# Patient Record
Sex: Female | Born: 1935 | Race: White | Hispanic: No
Health system: Southern US, Community
[De-identification: ages and names within clinical notes are randomized; demographics above are authoritative.]

## PROBLEM LIST (undated history)

## (undated) DIAGNOSIS — F0390 Unspecified dementia without behavioral disturbance: Secondary | ICD-10-CM

## (undated) DIAGNOSIS — I10 Essential (primary) hypertension: Secondary | ICD-10-CM

## (undated) DIAGNOSIS — G459 Transient cerebral ischemic attack, unspecified: Secondary | ICD-10-CM

## (undated) DIAGNOSIS — I2699 Other pulmonary embolism without acute cor pulmonale: Secondary | ICD-10-CM

## (undated) DIAGNOSIS — I82409 Acute embolism and thrombosis of unspecified deep veins of unspecified lower extremity: Secondary | ICD-10-CM

## (undated) DIAGNOSIS — K219 Gastro-esophageal reflux disease without esophagitis: Secondary | ICD-10-CM

## (undated) HISTORY — PX: HEMORRHOID SURGERY: SHX153

## (undated) HISTORY — PX: APPENDECTOMY: SHX54

---

## 2006-05-03 ENCOUNTER — Ambulatory Visit: Payer: Self-pay

## 2010-07-18 ENCOUNTER — Inpatient Hospital Stay (HOSPITAL_COMMUNITY)
Admission: EM | Admit: 2010-07-18 | Discharge: 2010-07-21 | DRG: 392 | Disposition: A | Discharge status: Home Health | Payer: Medicare Other | Source: Ambulatory Visit | Attending: Family Medicine | Admitting: Family Medicine

## 2010-07-18 ENCOUNTER — Emergency Department (HOSPITAL_COMMUNITY): Payer: Medicare Other

## 2010-07-18 DIAGNOSIS — K219 Gastro-esophageal reflux disease without esophagitis: Secondary | ICD-10-CM | POA: Diagnosis present

## 2010-07-18 DIAGNOSIS — E039 Hypothyroidism, unspecified: Secondary | ICD-10-CM | POA: Diagnosis present

## 2010-07-18 DIAGNOSIS — A088 Other specified intestinal infections: Principal | ICD-10-CM | POA: Diagnosis present

## 2010-07-18 DIAGNOSIS — F0390 Unspecified dementia without behavioral disturbance: Secondary | ICD-10-CM | POA: Diagnosis present

## 2010-07-18 DIAGNOSIS — I10 Essential (primary) hypertension: Secondary | ICD-10-CM | POA: Diagnosis present

## 2010-07-18 DIAGNOSIS — R339 Retention of urine, unspecified: Secondary | ICD-10-CM | POA: Diagnosis present

## 2010-07-18 LAB — URINALYSIS, ROUTINE W REFLEX MICROSCOPIC
Bilirubin Urine: NEGATIVE
Glucose, UA: NEGATIVE mg/dL
Hgb urine dipstick: NEGATIVE
Ketones, ur: NEGATIVE mg/dL
Nitrite: NEGATIVE
Protein, ur: NEGATIVE mg/dL
Specific Gravity, Urine: 1.02 (ref 1.005–1.030)
Urobilinogen, UA: 0.2 mg/dL (ref 0.0–1.0)
pH: 5 (ref 5.0–8.0)

## 2010-07-18 LAB — COMPREHENSIVE METABOLIC PANEL WITH GFR
ALT: 17 U/L (ref 0–35)
AST: 23 U/L (ref 0–37)
Alkaline Phosphatase: 65 U/L (ref 39–117)
CO2: 19 meq/L (ref 19–32)
Chloride: 106 meq/L (ref 96–112)
GFR calc Af Amer: 57 mL/min — ABNORMAL LOW (ref >60)
GFR calc non Af Amer: 47 mL/min — ABNORMAL LOW (ref >60)
Glucose, Bld: 115 mg/dL — ABNORMAL HIGH (ref 70–99)
Potassium: 3.1 meq/L — ABNORMAL LOW (ref 3.5–5.1)
Sodium: 135 meq/L (ref 135–145)

## 2010-07-18 LAB — DIFFERENTIAL
Band Neutrophils: 0 % (ref 0–10)
Basophils Absolute: 0 10*3/uL (ref 0.0–0.1)
Basophils Relative: 0 % (ref 0–1)
Blasts: 0 %
Eosinophils Absolute: 0.1 K/uL (ref 0.0–0.7)
Eosinophils Relative: 1 % (ref 0–5)
Lymphocytes Relative: 14 % (ref 12–46)
Lymphs Abs: 1.5 10*3/uL (ref 0.7–4.0)
Metamyelocytes Relative: 0 %
Monocytes Absolute: 1 K/uL (ref 0.1–1.0)
Monocytes Relative: 9 % (ref 3–12)
Myelocytes: 0 %
Neutro Abs: 8.4 10*3/uL — ABNORMAL HIGH (ref 1.7–7.7)
Neutrophils Relative %: 76 % (ref 43–77)
Promyelocytes Absolute: 0 %
nRBC: 0 /100{WBCs}

## 2010-07-18 LAB — COMPREHENSIVE METABOLIC PANEL
Albumin: 2.9 g/dL — ABNORMAL LOW (ref 3.5–5.2)
BUN: 28 mg/dL — ABNORMAL HIGH (ref 6–23)
Calcium: 8.1 mg/dL — ABNORMAL LOW (ref 8.4–10.5)
Creatinine, Ser: 1.09 mg/dL (ref 0.4–1.2)
Total Bilirubin: 0.5 mg/dL (ref 0.3–1.2)
Total Protein: 5.8 g/dL — ABNORMAL LOW (ref 6.0–8.3)

## 2010-07-18 LAB — CBC
HCT: 34.7 % — ABNORMAL LOW (ref 36.0–46.0)
Hemoglobin: 11.9 g/dL — ABNORMAL LOW (ref 12.0–15.0)
MCH: 30.5 pg (ref 26.0–34.0)
MCHC: 34.3 g/dL (ref 30.0–36.0)
MCV: 89 fL (ref 78.0–100.0)
Platelets: 252 K/uL (ref 150–400)
RBC: 3.9 MIL/uL (ref 3.87–5.11)
RDW: 13.5 % (ref 11.5–15.5)
WBC: 11 K/uL — ABNORMAL HIGH (ref 4.0–10.5)

## 2010-07-18 LAB — OCCULT BLOOD, POC DEVICE: Fecal Occult Bld: POSITIVE

## 2010-07-18 LAB — LACTIC ACID, PLASMA: Lactic Acid, Venous: 1.4 mmol/L (ref 0.5–2.2)

## 2010-07-19 DIAGNOSIS — E860 Dehydration: Secondary | ICD-10-CM

## 2010-07-19 DIAGNOSIS — E118 Type 2 diabetes mellitus with unspecified complications: Secondary | ICD-10-CM

## 2010-07-19 DIAGNOSIS — R197 Diarrhea, unspecified: Secondary | ICD-10-CM

## 2010-07-19 LAB — BASIC METABOLIC PANEL
BUN: 21 mg/dL (ref 6–23)
Chloride: 114 mEq/L — ABNORMAL HIGH (ref 96–112)
Glucose, Bld: 104 mg/dL — ABNORMAL HIGH (ref 70–99)
Potassium: 3.5 mEq/L (ref 3.5–5.1)

## 2010-07-19 LAB — CBC
HCT: 32.8 % — ABNORMAL LOW (ref 36.0–46.0)
MCV: 91.4 fL (ref 78.0–100.0)
RBC: 3.59 MIL/uL — ABNORMAL LOW (ref 3.87–5.11)
WBC: 6.8 10*3/uL (ref 4.0–10.5)

## 2010-07-19 LAB — TSH: TSH: 3.343 u[IU]/mL (ref 0.350–4.500)

## 2010-07-20 LAB — BASIC METABOLIC PANEL
BUN: 15 mg/dL (ref 6–23)
Chloride: 114 mEq/L — ABNORMAL HIGH (ref 96–112)
Glucose, Bld: 85 mg/dL (ref 70–99)
Potassium: 3.5 mEq/L (ref 3.5–5.1)

## 2010-07-20 LAB — FECAL LACTOFERRIN, QUANT: Fecal Lactoferrin: POSITIVE

## 2010-07-21 LAB — CBC
HCT: 30.7 % — ABNORMAL LOW (ref 36.0–46.0)
Hemoglobin: 10.4 g/dL — ABNORMAL LOW (ref 12.0–15.0)
MCV: 89.2 fL (ref 78.0–100.0)
RDW: 13.6 % (ref 11.5–15.5)
WBC: 6.8 10*3/uL (ref 4.0–10.5)

## 2010-07-21 LAB — BASIC METABOLIC PANEL
BUN: 12 mg/dL (ref 6–23)
CO2: 22 mEq/L (ref 19–32)
Chloride: 115 mEq/L — ABNORMAL HIGH (ref 96–112)
GFR calc non Af Amer: 55 mL/min — ABNORMAL LOW (ref >60)
Glucose, Bld: 87 mg/dL (ref 70–99)
Potassium: 3.3 mEq/L — ABNORMAL LOW (ref 3.5–5.1)

## 2010-07-23 LAB — STOOL CULTURE

## 2010-07-24 LAB — CULTURE, BLOOD (ROUTINE X 2)
Culture  Setup Time: 201204152001
Culture  Setup Time: 201204152001
Culture: NO GROWTH
Culture: NO GROWTH

## 2010-07-26 NOTE — Discharge Summary (Signed)
Breanna Washington, Breanna Washington                  ACCOUNT NO.:  [id number]  MEDICAL RECORD NO.:  [id number]           PATIENT TYPE:  I  LOCATION:  6741                         FACILITY:  [facility]  PHYSICIAN:  Santiago Bumpers. Hensel, M.D.DATE OF BIRTH:  Jun 28, 1919  DATE OF ADMISSION:  07/18/2010 DATE OF DISCHARGE:  07/21/2010                              DISCHARGE SUMMARY   DISCHARGE DIAGNOSES: 1. Diarrhea and weakness secondary to viral gastroenteritis. 2. Urinary retention and overflow incontinence. 3. Hypertension. 4. Dementia. 5. History of gastroesophageal reflux disease. 6. History of hypothyroidism.  DISCHARGE MEDICATIONS:  New medications at discharge: 1. Bethanechol 5 mg p.o. t.i.d.  Home medications continued: 1. Central Silver half tablet p.o. daily. 2. Namenda 10 mg p.o. twice daily. 3. Omeprazole 20 mg p.o. daily at bedtime. 4. Synthroid 25 mcg 1 tablet p.o. daily.  Home medications to stop: 1. HCTZ 12.5 p.o. q.a.m. 2. Afeditab CR 30 mg 1 tablet p.o. q.a.m.  PERTINENT LABS AT DISCHARGE:  Stool cultures, no suspicious colonies, continuing to hold.  Creatinine 0.96.  Potassium 3.3.  Hemoglobin 10.4, hematocrit 30.7, white blood cells 6.8, platelets 234.  Stool Crypto and stool Giardia negative.  Fecal lactoferrin positive.  TSH 3.343.  C. diff PCR negative.  Blood cultures, no growth to date x3 days.  CONSULTS:  None.  BRIEF HOSPITAL COURSE:  The patient is an 77 year old female who came in with history of chronic diarrhea, acutely worsened over 30 hours prior to admission. 1. Diarrhea.  The patient was initially evaluated, found to be     hypotensive on admission with following blood pressure despite     fluid resuscitation, very weak on exam without focal neurological     deficits.  It was determined that she was dehydrated and have     persistent diarrhea possibly from infectious etiology viral or     bacterial workup considered.  C. diff PCR was obtained.  The     patient  was started on p.o. vancomycin as well as IV vancomycin and     Zosyn for concerning sepsis given that the patient was hypotensive     on initial evaluation.  The patient's blood cultures were negative     as mentioned above as well as C. diff PCR and stool cultures, stool     Giardia and Crypto.  She is resistant to all antibiotic therapy.     She was rehydrated with IV fluids, transitioned to a normal p.o.     diet.  The patient was initially admitted to step-down unit, but     then to the floor and did well without antibiotic therapy for     greater than 24 hours.  She was afebrile.  Her white count trend     down from initial white count of 11 to discharge white count of     6.8. 2. Weakness.  The patient was acutely deconditioned from her illness.     She was evaluated by PT during her hospital course, and if her     strength improved, it was determined that she would benefit from  home health PT for continued rehab.  She was able to walk her     walker with some assistance and ambulate to the bedside commode. 3. Urinary retention.  On hospital day #2, it was determined that the     patient is suffering from urinary retention.  Bladder scans     revealed elevated volumes of urine, pre and postvoid, initially 494     prevoid and 374 postvoid.  The patient was started on bethanechol 5     mg p.o. t.i.d.  The next bladder scan obtained the following day     was greater than 999 mL prevoid, down to 72 postvoid.  It was     determined that bethanechol was effective with most likely a     longstanding overflow incontinence from neurogenic bladder.  At     discharge, the patient is instructed to continue bethanechol and to     schedule voids every 4 hours while awake. 4. Hypertension.  The patient has a history of hypertension, treated     with HCTZ and Afeditab.  On admission, both were held since the     patient was hypotensive.  At discharge, the patient's blood     pressures are  well controlled without treatment.  Her blood     pressures are ranged from 123-145 systolic over 61-72 diastolic.     Plan is for the patient to discontinue her antihypertensive therapy     with goal systolic blood pressure less than or equal to 60. 5. Dementia.  The patient's mental status was stable, well controlled     on her home dose of Namenda. 6. History of hypothyroidism.  The patient's TSH was normal.  As     mentioned above, she continued on her home dose of Synthroid. 7. History of GERD.  The patient was taking Protonix during her     hospital stay and discharged on her home dose of omeprazole.  DISCHARGE PHYSICAL EXAM:  At discharge, the patient is in no acute distress with normal heart and lung exam.  Her abdomen is soft, nontender, nondistended with normoactive bowel sounds.  Exam of her rectum reveals an irritated gluteal cleft with slightly decreased rectal tone, loose watery stool in the rectal vault with some flecks of blood, but no frank blood.  The patient was fecal occult blood positive on admission likely secondary to irritation.  DISCHARGE INSTRUCTIONS:  The patient is discharged to home with instructions to advance activity slowly, to walk with her walker, no restrictions on diet, no wound care instructions.  She is instructed to follow up with her primary MD, Dr. Loma Sender in [place].  She was provided the number and instructed to call for an appointment within the next 3-4 weeks.  She is also instructed to begin home health PT to be set up by the patient's social worker.  Other instructions to void regularly every 4 hours while awake, to apply barrier cream either A and D or Desitin to her bottom 2-3 times a day for irritation.  FOLLOWUP ISSUES AND RECOMMENDATIONS: 1. Diarrhea.  The patient's diarrhea should resolve within the next     week.  Instructed not to use antidiarrhea medications, but is said     to keep up with losses with regular meals  and increased fluid     intake. 2. Urinary retention.  The patient will started on bethanechol, this     is a new medication, follow up compliance in complications  regarding urinary retention.  The patient's admission urinalysis     was within normal limits. 3. Hypertension.  The patient is discharged off of her home     antihypertensive regimen.  Follow up blood pressures in the need     for possibly restarting one or both medications.  DISCHARGE CONDITION:  The patient is discharged to home in stable medical condition.    ______________________________ Dessa Phi, MD   ______________________________ Santiago Bumpers. Leveda Anna, M.D.    JF/MEDQ  D:  07/21/2010  T:  07/22/2010  Job:  [id number]  Electronically Signed by Dessa Phi MD on 07/25/2010 01:46:25 AM Electronically Signed by Doralee Albino M.D. on 07/26/2010 11:14:29 AM

## 2010-08-02 NOTE — H&P (Signed)
Breanna Washington, Breanna Washington                  ACCOUNT NO.:  [id number]  MEDICAL RECORD NO.:  [id number]           PATIENT TYPE:  I  LOCATION:  3312                         FACILITY:  [facility]  PHYSICIAN:  Deb Loudin A. Sheffield Slider, M.D.    DATE OF BIRTH:  01/24/1920  DATE OF ADMISSION:  07/18/2010 DATE OF DISCHARGE:                             HISTORY & PHYSICAL   PRIMARY CARE PHYSICIAN:  Dr. Loma Sender in [place].  CHIEF COMPLAINT:  Diarrhea and weakness.  HISTORY OF PRESENT ILLNESS:  The patient is an 77 year old female with chronic constipation at baseline who presents with a 92-month history of intermittent diarrhea followed by a 1 day history of persistent diarrhea.  She has had 7 stools in the past 12 hours prior to arrival to the ED.  Stools are described as loose, nonbloody, moderate volume. This morning, she also had bilateral lower abdominal pain that radiated to the anterior aspect of both thighs.  She and family denied nausea and vomiting.  She does have a sick contact in the form of her son, who is visiting and had a GI illness with nausea and diarrhea.  She has had increased fatigue, decreased appetite, and decreased activity over the past 2 days.  She usually ambulates at home with her walker, but has had no interest in doing so for the past and a half.  No fevers at home, but she did so want to touch last night and she is febrile in the ED.  In the ED, she received Zofran for IV x1, Tylenol 1 g p.o. x1, Imodium 2 mg p.o. x 1, 20 mEq of KCl, 500 mL bolus of normal saline x2, then 125 mL per hour.  Code status DNR/DNI health care power of attorney with her daughter, Alvino Chapel and her son.  ALLERGIES:  No known drug allergies.  MEDICATIONS: 1. Namenda 10 mg p.o. q.a.m. and q.p.m. 2. Feratab 30 mg p.o. q.a.m. 3. Synthroid 25 mcg q.a.m. 4. Aspirin 81 mg p.o. daily. 5. Hydrochlorothiazide 12.5 mg p.o. daily. 6. Omeprazole 20 mg p.o. daily. 7. Centrum Silver 1 tablet p.o.  daily.  PAST MEDICAL HISTORY: 1. Significant for dementia.  The patient occasionally voices desire     to die, stopped eating.  Her symptoms worsened when her husband     passed away last year. 2. Hypertension. 3. Hypothyroidism. 4. GERD. 5. No known screening for colonoscopy.  PAST SURGICAL HISTORY:  Appendectomy in about 1950, hemorrhoidectomy in about 6.  SOCIAL HISTORY:  The patient lives with her son and daughter-in-law. She chews tobacco and has since age 81.  Denies alcohol, denies drugs.  FAMILY HISTORY:  Noncontributory.  REVIEW OF SYSTEMS:  Pertinent positives for fever, decreased appetite, increased fatigue, diarrhea, abdominal pain.  Pertinent negatives no nausea, no vomiting, no dysphagia, no hematemesis, no bright red blood per rectum, no melena, no chills, no night sweats, no weight change. All remainder of her 11-point review of systems negative.  PHYSICAL EXAMINATION:  VITAL SIGNS:  Temperature 101.7 down to 99.1, pulse 155, respiratory rate 16-18, blood pressure on admission 141/63, last checked 107/40, pO2 of 97%  on room air. GENERAL:  The patient is an elderly woman lying in bed. HEENT:  NCAT.  Pinpoint pupils.  Dry mucous membrane.  Edentate. 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  NECK:  Supple, nontender.  No lymphadenopathy. CV:  S1 and S2.  Regular rate and rhythm. LUNGS:  Normal work of breathing.  Clear to auscultation bilaterally. No wheezes, rhonchi, or crackles. ABDOMEN:  Normoactive bowel sounds, soft, tender to palpation in epigastric area and bilateral lower quadrants.  No suprapubic tenderness.  No rebound, no guarding. BACK:  Nontender. RECTAL:  No external hemorrhoids.  No skin tag.  No surgeries.  No blood.  Normal tone.  No frank blood in stool. EXTREMITIES:  Nontender.  No edema. NEURO:  Alert and oriented x2 to person, place, not to time.  Able  to recognize daughter.  Cranial nerves II through XII were grossly intact. A 5/5 strength in bilateral upper and lower extremities. MUSCULOSKELETAL:  Nontender.  LABS AND STUDIES:  White count 11, hemoglobin 11.9, hematocrit 34.7, platelets 52, 8.4% neutrophils.  Fecal occult blood positive.  Lactic acid 1.4, potassium 3.1, creatinine 1.09, albumin 2.9, calcium 8.1, corrected calcium of 8.98.  Abdominal series within normal limits. Normal bowel gas pattern.  UA specific gravity 1.02, otherwise negative.  ASSESSMENT AND PLAN:  For this 77 year old female with acute worsening diarrhea, hypotension, and fever.  1. Diarrhea.  Clinical picture was hospitalized with the symptoms,     concerning for infectious etiology, viral multifactorial colitis.     Given the patient's blood pressures are low despite fluid     resuscitation, the bacterial etiology is most concerning and the     possibility of sepsis and septic shock is also concerning.  Plan to     admit to step-down unit.  Continue aggressive hydration with     another 500 mL bolus x2 and then D5 half normal saline with 20 of     KCl at 125 mL an hour.  We will obtain pancultures, procalcitonin,     and chest x-Thurow and start empiric antibiotic therapy in the form of     vanc and Zosyn IV and p.o. vanc to cover for C. diff.  We will     discontinue IV antibiotics.  Her blood cultures are negative for 48     hours.  If the patient does go through septic shock, it should be     reminded that she is DNR/DNI. 2. Fluids, electrolytes, nutrition/gastrointestinal:  The patient with     hypokalemia, likely from persistent diarrhea.  We will replete with     IV fluids.  Follow up a.m. BMET, give regular diet. 3. DVT prophylaxis.  SCDs. 4. Dementia.  The patient has waxing and waning level of alertness.     We will continue home Namenda. 5. History of hypertension.  Hypotensive now hold all antihypertensive     medications. 6. Hypothyroidism.   Continue home Synthroid. 7. GI prophylaxis.  Protonix 40 mg p.o. b.i.d. 8. Disposition.  Pending clinical improvement, back to home.  We will     obtain home health PT consult to evaluate for home health needs.    ______________________________ Dessa Phi, MD   ______________________________ Arnette Norris. Sheffield Slider, M.D.    JF/MEDQ  D:  07/18/2010  T:  07/19/2010  Job:  [id number]  Electronically Signed by Dessa Phi MD on 07/25/2010 01:44:56 AM Electronically Signed by Zachery Dauer M.D. on 08/02/2010 04:42:04  AM

## 2011-04-30 LAB — LIPASE, BLOOD: Lipase: 199 U/L (ref 73–393)

## 2011-04-30 LAB — COMPREHENSIVE METABOLIC PANEL
Alkaline Phosphatase: 84 U/L (ref 50–136)
Bilirubin,Total: 0.5 mg/dL (ref 0.2–1.0)
Calcium, Total: 9 mg/dL (ref 8.5–10.1)
Chloride: 104 mmol/L (ref 98–107)
Co2: 29 mmol/L (ref 21–32)
Creatinine: 1.05 mg/dL (ref 0.60–1.30)
EGFR (Non-African Amer.): 52 — ABNORMAL LOW
Osmolality: 289 (ref 275–301)
SGPT (ALT): 15 U/L
Sodium: 143 mmol/L (ref 136–145)

## 2011-04-30 LAB — CBC
HGB: 12.7 g/dL (ref 12.0–16.0)
MCH: 30.6 pg (ref 26.0–34.0)
MCHC: 33.6 g/dL (ref 32.0–36.0)
MCV: 91 fL (ref 80–100)
RBC: 4.14 10*6/uL (ref 3.80–5.20)

## 2011-04-30 LAB — CK TOTAL AND CKMB (NOT AT ARMC): CK-MB: <0.5 ng/mL — ABNORMAL LOW (ref 0.5–3.6)

## 2011-04-30 LAB — TROPONIN I: Troponin-I: <0.02 ng/mL

## 2011-05-01 ENCOUNTER — Inpatient Hospital Stay: Payer: Self-pay | Admitting: Internal Medicine

## 2011-05-01 LAB — PROTIME-INR: Prothrombin Time: 12.6 secs (ref 11.5–14.7)

## 2011-05-02 LAB — BASIC METABOLIC PANEL
Anion Gap: 10 (ref 7–16)
BUN: 23 mg/dL — ABNORMAL HIGH (ref 7–18)
Calcium, Total: 8.4 mg/dL — ABNORMAL LOW (ref 8.5–10.1)
Chloride: 107 mmol/L (ref 98–107)
Osmolality: 289 (ref 275–301)

## 2011-05-03 LAB — PROTIME-INR
INR: 1.2
Prothrombin Time: 15.8 secs — ABNORMAL HIGH (ref 11.5–14.7)

## 2011-05-04 LAB — PROTIME-INR
INR: 1.2
Prothrombin Time: 15.3 secs — ABNORMAL HIGH (ref 11.5–14.7)

## 2011-05-04 LAB — PLATELET COUNT: Platelet: 255 10*3/uL (ref 150–440)

## 2011-05-05 LAB — PROTIME-INR: Prothrombin Time: 20.7 secs — ABNORMAL HIGH (ref 11.5–14.7)

## 2011-05-06 LAB — PROTIME-INR: Prothrombin Time: 26.7 secs — ABNORMAL HIGH (ref 11.5–14.7)

## 2011-10-12 ENCOUNTER — Emergency Department: Payer: Self-pay | Admitting: *Deleted

## 2011-10-12 LAB — BASIC METABOLIC PANEL
BUN: 18 mg/dL (ref 7–18)
Creatinine: 1.21 mg/dL (ref 0.60–1.30)
EGFR (African American): 46 — ABNORMAL LOW
EGFR (Non-African Amer.): 40 — ABNORMAL LOW
Glucose: 114 mg/dL — ABNORMAL HIGH (ref 65–99)
Osmolality: 293 (ref 275–301)

## 2011-10-12 LAB — URINALYSIS, COMPLETE
Blood: NEGATIVE
Glucose,UR: NEGATIVE mg/dL (ref 0–75)
Nitrite: NEGATIVE
Ph: 6 (ref 4.5–8.0)
RBC,UR: 2 /HPF (ref 0–5)

## 2011-10-12 LAB — CBC
HCT: 38.6 % (ref 35.0–47.0)
MCH: 30.4 pg (ref 26.0–34.0)
MCHC: 33.8 g/dL (ref 32.0–36.0)
RDW: 13 % (ref 11.5–14.5)

## 2011-10-12 LAB — TROPONIN I: Troponin-I: <0.02 ng/mL

## 2011-10-12 LAB — CK TOTAL AND CKMB (NOT AT ARMC): CK-MB: 0.7 ng/mL (ref 0.5–3.6)

## 2011-10-19 ENCOUNTER — Encounter (HOSPITAL_COMMUNITY): Payer: Self-pay | Admitting: *Deleted

## 2011-10-19 ENCOUNTER — Emergency Department (HOSPITAL_COMMUNITY)
Admission: EM | Admit: 2011-10-19 | Discharge: 2011-10-20 | Disposition: A | Discharge status: Home / Self Care | Payer: Medicare Other | Attending: Emergency Medicine | Admitting: Emergency Medicine

## 2011-10-19 DIAGNOSIS — K219 Gastro-esophageal reflux disease without esophagitis: Secondary | ICD-10-CM | POA: Insufficient documentation

## 2011-10-19 DIAGNOSIS — R109 Unspecified abdominal pain: Secondary | ICD-10-CM

## 2011-10-19 DIAGNOSIS — Z86718 Personal history of other venous thrombosis and embolism: Secondary | ICD-10-CM | POA: Insufficient documentation

## 2011-10-19 DIAGNOSIS — I10 Essential (primary) hypertension: Secondary | ICD-10-CM | POA: Insufficient documentation

## 2011-10-19 DIAGNOSIS — N390 Urinary tract infection, site not specified: Secondary | ICD-10-CM | POA: Insufficient documentation

## 2011-10-19 DIAGNOSIS — Z86711 Personal history of pulmonary embolism: Secondary | ICD-10-CM | POA: Insufficient documentation

## 2011-10-19 DIAGNOSIS — R197 Diarrhea, unspecified: Secondary | ICD-10-CM | POA: Insufficient documentation

## 2011-10-19 DIAGNOSIS — Z8673 Personal history of transient ischemic attack (TIA), and cerebral infarction without residual deficits: Secondary | ICD-10-CM | POA: Insufficient documentation

## 2011-10-19 DIAGNOSIS — F0390 Unspecified dementia without behavioral disturbance: Secondary | ICD-10-CM | POA: Insufficient documentation

## 2011-10-19 HISTORY — DX: Gastro-esophageal reflux disease without esophagitis: K21.9

## 2011-10-19 HISTORY — DX: Other pulmonary embolism without acute cor pulmonale: I26.99

## 2011-10-19 HISTORY — DX: Unspecified dementia, unspecified severity, without behavioral disturbance, psychotic disturbance, mood disturbance, and anxiety: F03.90

## 2011-10-19 HISTORY — DX: Acute embolism and thrombosis of unspecified deep veins of unspecified lower extremity: I82.409

## 2011-10-19 HISTORY — DX: Transient cerebral ischemic attack, unspecified: G45.9

## 2011-10-19 HISTORY — DX: Essential (primary) hypertension: I10

## 2011-10-19 LAB — COMPREHENSIVE METABOLIC PANEL
Albumin: 3.9 g/dL (ref 3.5–5.2)
Alkaline Phosphatase: 73 U/L (ref 39–117)
BUN: 18 mg/dL (ref 6–23)
Chloride: 103 mEq/L (ref 96–112)
Glucose, Bld: 121 mg/dL — ABNORMAL HIGH (ref 70–99)
Potassium: 3.4 mEq/L — ABNORMAL LOW (ref 3.5–5.1)
Total Bilirubin: 0.3 mg/dL (ref 0.3–1.2)

## 2011-10-19 LAB — URINE MICROSCOPIC-ADD ON

## 2011-10-19 LAB — URINALYSIS, ROUTINE W REFLEX MICROSCOPIC
Glucose, UA: NEGATIVE mg/dL
Protein, ur: NEGATIVE mg/dL
pH: 6 (ref 5.0–8.0)

## 2011-10-19 LAB — CBC WITH DIFFERENTIAL/PLATELET
Blasts: 0 %
Lymphocytes Relative: 25 % (ref 12–46)
Lymphs Abs: 1.3 10*3/uL (ref 0.7–4.0)
MCHC: 33.8 g/dL (ref 30.0–36.0)
Neutro Abs: 3.3 10*3/uL (ref 1.7–7.7)
Neutrophils Relative %: 65 % (ref 43–77)
Platelets: 324 10*3/uL (ref 150–400)
Promyelocytes Absolute: 0 %
RDW: 13.2 % (ref 11.5–15.5)

## 2011-10-19 LAB — PROTIME-INR: Prothrombin Time: 23.5 seconds — ABNORMAL HIGH (ref 11.6–15.2)

## 2011-10-19 MED ORDER — SODIUM CHLORIDE 0.9 % IV BOLUS (SEPSIS)
1000.0000 mL | Freq: Once | INTRAVENOUS | Status: AC
  Administered 2011-10-19: 1000 mL via INTRAVENOUS

## 2011-10-19 MED ORDER — MORPHINE SULFATE 4 MG/ML IJ SOLN
4.0000 mg | Freq: Once | INTRAMUSCULAR | Status: DC
  Filled 2011-10-19: qty 1

## 2011-10-19 NOTE — ED Notes (Signed)
Patient reported to be seen at [facility] last week and dx with virus.  She had been seen for ongoing diarrhea.    Patient was seen for her coumadin check on yesterday and told her that her stomach pain is related to gerd.  Patient had onset of severe stomach pain last night and family reports blood in her stool.  Patient states she just feels bad

## 2011-10-19 NOTE — ED Notes (Signed)
States she has had diarrhea for quite awhile. Getting progressively worse. Also complaining of abdominal pain. Noted blood in her stool last night. Noted change in smell to her stool. Patient takes coumadin

## 2011-10-19 NOTE — ED Provider Notes (Signed)
History     CSN: [id number]  Arrival date & time 10/19/11  1435   First MD Initiated Contact with Patient 10/19/11 2110      Chief Complaint  Patient presents with  . Abdominal Pain  . Rectal Bleeding    (Consider location/radiation/quality/duration/timing/severity/associated sxs/prior treatment) Patient is a 78 y.o. female presenting with abdominal pain. The history is provided by the patient and a relative.  Abdominal Pain The primary symptoms of the illness include diarrhea. The primary symptoms of the illness do not include abdominal pain, fever, fatigue, shortness of breath, nausea, vomiting or dysuria. The current episode started more than 2 days ago. The problem has been gradually worsening.  The diarrhea is unusually odiferous and watery. Risk factors: unknown.  The patient has had a change in bowel habit. Symptoms associated with the illness do not include chills, constipation, urgency, frequency or back pain. Significant associated medical issues do not include inflammatory bowel disease.    Past Medical History  Diagnosis Date  . Dementia   . GERD (gastroesophageal reflux disease)   . Deep vein thrombosis   . Pulmonary embolism   . Hypertension   . TIA (transient ischemic attack)     Past Surgical History  Procedure Date  . Appendectomy   . Hemorrhoid surgery     No family history on file.  History  Substance Use Topics  . Smoking status: Not on file  . Smokeless tobacco: Current User    Types: Snuff  . Alcohol Use: No    OB History    Grav Para Term Preterm Abortions TAB SAB Ect Mult Living                  Review of Systems  Constitutional: Negative for fever, chills and fatigue.  HENT: Negative for congestion and rhinorrhea.   Respiratory: Negative for cough and shortness of breath.   Cardiovascular: Negative for chest pain and palpitations.  Gastrointestinal: Positive for diarrhea, blood in stool and abdominal distention (mild). Negative for  nausea, vomiting, abdominal pain and constipation.  Genitourinary: Negative for dysuria, urgency and frequency.  Musculoskeletal: Negative for back pain.  Skin: Negative for color change and rash.  Neurological: Negative for light-headedness and headaches.  All other systems reviewed and are negative.    Allergies  Review of patient's allergies indicates no known allergies.  Home Medications   Current Outpatient Rx  Name Route Sig Dispense Refill  . ACETAMINOPHEN 500 MG PO TABS Oral Take 500 mg by mouth every 6 (six) hours as needed. For pain    . AMLODIPINE BESYLATE 5 MG PO TABS Oral Take 5 mg by mouth daily.    Marland Kitchen HYDROCHLOROTHIAZIDE 12.5 MG PO CAPS Oral Take 12.5 mg by mouth daily.    Marland Kitchen LEVOTHYROXINE SODIUM 25 MCG PO TABS Oral Take 25 mcg by mouth daily.    Marland Kitchen MEMANTINE HCL 10 MG PO TABS Oral Take 10 mg by mouth daily.    Marland Kitchen OMEPRAZOLE 20 MG PO TBEC Oral Take 1 tablet by mouth daily.    . WARFARIN SODIUM 2 MG PO TABS Oral Take 2-4 mg by mouth daily. Sun, mon, wed, fri, sat = 1 tab tue thur = 2 tabs      BP 138/60  Pulse 67  Temp 97.9 F (36.6 C) (Oral)  Resp 8  Ht 5\' 3"  (1.6 m)  Wt 112 lb (50.803 kg)  BMI 19.84 kg/m2  SpO2 98%  Physical Exam  Nursing note and vitals reviewed. Constitutional:  She is oriented to person, place, and time. She appears well-developed and well-nourished.  HENT:  Head: Normocephalic and atraumatic.  Eyes: Pupils are equal, round, and reactive to light.  Cardiovascular: Normal rate, regular rhythm, normal heart sounds and intact distal pulses.   Pulmonary/Chest: Effort normal and breath sounds normal. No respiratory distress.  Abdominal: Soft. Bowel sounds are normal. She exhibits no distension. There is tenderness (moderate, lower abd). There is no rebound and no guarding.  Genitourinary: Rectal exam shows external hemorrhoid (small, posterior, nontender). Guaiac negative stool (normal brown).  Neurological: She is alert and oriented to person,  place, and time.  Skin: Skin is warm and dry.  Psychiatric: She has a normal mood and affect.    ED Course  Procedures (including critical care time)  Labs Reviewed  COMPREHENSIVE METABOLIC PANEL - Abnormal; Notable for the following:    Potassium 3.4 (*)     Glucose, Bld 121 (*)     GFR calc non Af Amer 43 (*)     GFR calc Af Amer 50 (*)     All other components within normal limits  PROTIME-INR - Abnormal; Notable for the following:    Prothrombin Time 23.5 (*)     INR 2.05 (*)     All other components within normal limits  URINALYSIS, ROUTINE W REFLEX MICROSCOPIC - Abnormal; Notable for the following:    APPearance CLOUDY (*)     Hgb urine dipstick SMALL (*)     Leukocytes, UA LARGE (*)     All other components within normal limits  URINE MICROSCOPIC-ADD ON - Abnormal; Notable for the following:    Squamous Epithelial / LPF MANY (*)     Bacteria, UA MANY (*)     All other components within normal limits  CBC WITH DIFFERENTIAL  OCCULT BLOOD, POC DEVICE  CLOSTRIDIUM DIFFICILE BY PCR   No results found.   1. Abdominal pain   2. Diarrhea   3. UTI (urinary tract infection)       MDM  This is an 78 year old female presents today with about a month's worth of abdominal pain. The patient states that she has intermittent episodes of pain that would double her over, and in between the episodes will have complete relief of her symptoms. She had over the past month has been getting worse in intensity as well as frequency. She denies any nausea or vomiting. For the past couple of months he has had some diarrhea, which he states has gotten worse over the past couple of weeks, according to family has been particularly malodorous recently. They noticed that today when she went to the bathroom there was a small amount of blood on the toilet tissue, she has not previously had any blood in her stools. Approximately one week ago she was seen at an outside hospital, where blood work was done  and was unremarkable, as the patient was discharged home. She was seen by her regular Dr. for this, and did not have any workup done at that time. However they state that the pain is worse in between this and the blood in her stool the daughter for reevaluation. At this point in time the patient appears comfortable, does have moderate tenderness of her lower abdomen but exam is otherwise unremarkable. Blood work relatively unremarkable, though it does look like she has a UTI.. Given the patient's age and her persistent pain and diarrhea, we'll check a CT scan to ensure no acute intra-abdominal pathology, and we'll check  a C. difficile toxin given her report of malodorous diarrhea.  CT scan is still pending at this point in time, and we are still waiting to collect a stool sample from the patient. Care of patient transferred to Dr Verl Bangs pending results of her CT scan and stool being obtained for final dispo.  Theotis Burrow, MD 10/20/11 [phone]

## 2011-10-19 NOTE — ED Notes (Addendum)
Pt c/o abdominal pain blood in stool. Pt reports she is having some heart burn. Pt has hx of dementia. Pt's family reports she has been having intermittent abdominal pain X 3 weeks, it has gotten worse today, lower abdomen. Pt has been having diarrhea. Pt denies dysuria, back pain and urinary frequency.

## 2011-10-20 ENCOUNTER — Emergency Department (HOSPITAL_COMMUNITY): Payer: Medicare Other

## 2011-10-20 ENCOUNTER — Encounter (HOSPITAL_COMMUNITY): Payer: Self-pay | Admitting: Radiology

## 2011-10-20 LAB — CLOSTRIDIUM DIFFICILE BY PCR: Toxigenic C. Difficile by PCR: NEGATIVE

## 2011-10-20 MED ORDER — DEXTROSE 5 % IV SOLN
1.0000 g | Freq: Once | INTRAVENOUS | Status: AC
  Administered 2011-10-20: 1 g via INTRAVENOUS
  Filled 2011-10-20: qty 10

## 2011-10-20 MED ORDER — GI COCKTAIL ~~LOC~~
30.0000 mL | Freq: Once | ORAL | Status: AC
  Administered 2011-10-20: 30 mL via ORAL
  Filled 2011-10-20: qty 30

## 2011-10-20 MED ORDER — CEPHALEXIN 250 MG PO CAPS: 250.0000 mg | ORAL_CAPSULE | Freq: Three times a day (TID) | ORAL | Status: AC

## 2011-10-20 MED ORDER — IOHEXOL 300 MG/ML  SOLN
20.0000 mL | INTRAMUSCULAR | Status: AC
  Administered 2011-10-20: 20 mL via ORAL

## 2011-10-20 MED ORDER — IOHEXOL 300 MG/ML  SOLN
100.0000 mL | Freq: Once | INTRAMUSCULAR | Status: AC | PRN
  Administered 2011-10-20: 100 mL via INTRAVENOUS

## 2011-10-20 NOTE — ED Provider Notes (Signed)
History     CSN: [id number]  Arrival date & time 10/19/11  1435   First MD Initiated Contact with Patient 10/19/11 2110      Chief Complaint  Patient presents with  . Abdominal Pain  . Rectal Bleeding    (Consider location/radiation/quality/duration/timing/severity/associated sxs/prior treatment) HPI  Past Medical History  Diagnosis Date  . Dementia   . GERD (gastroesophageal reflux disease)   . Deep vein thrombosis   . Pulmonary embolism   . Hypertension   . TIA (transient ischemic attack)     Past Surgical History  Procedure Date  . Appendectomy   . Hemorrhoid surgery     No family history on file.  History  Substance Use Topics  . Smoking status: Not on file  . Smokeless tobacco: Current User    Types: Snuff  . Alcohol Use: No    OB History    Grav Para Term Preterm Abortions TAB SAB Ect Mult Living                  Review of Systems  Allergies  Review of patient's allergies indicates no known allergies.  Home Medications   Current Outpatient Rx  Name Route Sig Dispense Refill  . ACETAMINOPHEN 500 MG PO TABS Oral Take 500 mg by mouth every 6 (six) hours as needed. For pain    . AMLODIPINE BESYLATE 5 MG PO TABS Oral Take 5 mg by mouth daily.    Marland Kitchen HYDROCHLOROTHIAZIDE 12.5 MG PO CAPS Oral Take 12.5 mg by mouth daily.    Marland Kitchen LEVOTHYROXINE SODIUM 25 MCG PO TABS Oral Take 25 mcg by mouth daily.    Marland Kitchen MEMANTINE HCL 10 MG PO TABS Oral Take 10 mg by mouth daily.    Marland Kitchen OMEPRAZOLE 20 MG PO TBEC Oral Take 1 tablet by mouth daily.    . WARFARIN SODIUM 2 MG PO TABS Oral Take 2-4 mg by mouth daily. Sun, mon, wed, fri, sat = 1 tab tue thur = 2 tabs      BP 138/60  Pulse 67  Temp 97.9 F (36.6 C) (Oral)  Resp 8  Ht 5\' 3"  (1.6 m)  Wt 112 lb (50.803 kg)  BMI 19.84 kg/m2  SpO2 98%  Physical Exam  ED Course  Procedures (including critical care time)  Labs Reviewed  COMPREHENSIVE METABOLIC PANEL - Abnormal; Notable for the following:    Potassium 3.4 (*)      Glucose, Bld 121 (*)     GFR calc non Af Amer 43 (*)     GFR calc Af Amer 50 (*)     All other components within normal limits  PROTIME-INR - Abnormal; Notable for the following:    Prothrombin Time 23.5 (*)     INR 2.05 (*)     All other components within normal limits  URINALYSIS, ROUTINE W REFLEX MICROSCOPIC - Abnormal; Notable for the following:    APPearance CLOUDY (*)     Hgb urine dipstick SMALL (*)     Leukocytes, UA LARGE (*)     All other components within normal limits  URINE MICROSCOPIC-ADD ON - Abnormal; Notable for the following:    Squamous Epithelial / LPF MANY (*)     Bacteria, UA MANY (*)     All other components within normal limits  CBC WITH DIFFERENTIAL  OCCULT BLOOD, POC DEVICE  CLOSTRIDIUM DIFFICILE BY PCR  URINE CULTURE   Ct Abdomen Pelvis W Contrast  10/20/2011  *RADIOLOGY REPORT*  Clinical Data:  Lower abdominal pain.  New rectal bleeding today. Diarrhea.  CT ABDOMEN AND PELVIS WITH CONTRAST  Technique:  Multidetector CT imaging of the abdomen and pelvis was performed following the standard protocol during bolus administration of intravenous contrast.  Contrast: 1 OMNIPAQUE IOHEXOL 300 MG/ML  SOLN, OMNIPAQUE IOHEXOL 300 MG/ML  SOLN  Comparison: None.  Findings: Infiltration or atelectasis bases.  Moderate sized esophageal hiatal hernia.  The liver, spleen, gallbladder, pancreas, adrenal glands, and retroperitoneal lymph nodes are unremarkable.  Diffuse calcification of the abdominal aorta and branch vessels without aneurysm.  Portal and mesenteric vessels appear patent.  Cyst in the upper pole of the left kidney.  No solid mass or hydronephrosis in either kidney.  The stomach is moderately well distended without evidence of wall thickening.  No small bowel dilatation or wall thickening.  Diffusely stool filled colon without distension or wall thickening.  No free air or free fluid in the abdomen.  Pelvis:  The bladder is significantly distended.  No bladder  wall thickening.  Nodular calcifications within the uterus consistent with fibroids.  No abnormal adnexal masses.  No free or loculated pelvic fluid collections.  Diverticulosis of the sigmoid colon without diverticulitis.  The appendix is not identified. Degenerative changes in the lumbar spine.  Superior endplate compression of L1.  IMPRESSION: Esophageal hiatal hernia.  Renal cysts.  Vascular calcifications. Diffuse distension of the bladder.  Calcified uterine fibroids.  No acute process demonstrated in the abdomen or pelvis.  Original Report Authenticated By: Marlon Pel, M.D.     1. Abdominal pain   2. Diarrhea   3. UTI (urinary tract infection)       MDM  Ct neg.  + uti will treat.  Stool neg for cdiff.  Abx,  Urine culture.  outpt fu        Cicilia Clinger Lytle Michaels, MD 10/20/11 [phone]

## 2011-10-20 NOTE — ED Notes (Signed)
Returned from CT.

## 2011-10-20 NOTE — ED Provider Notes (Signed)
I have seen and examined this patient with the resident.  I agree with the resident's note, assessment and plan except as indicated.    Patient with lower abdominal pain for the last 3 weeks.  She has had some increased watery diarrhea so a C. difficile PCR was added.  Patient will receive a CT scan of her abdomen pelvis to look for other possible abnormalities.  Nat Christen, MD 10/20/11 [phone]

## 2011-10-20 NOTE — ED Notes (Signed)
Patient finished drinking oral contrast.

## 2011-10-21 LAB — URINE CULTURE

## 2011-10-22 NOTE — ED Notes (Signed)
+  Urine. Patient given Keflex. Resistant. Chart sent to EDP office for review. °

## 2011-10-23 NOTE — ED Notes (Signed)
Spoke with patient's son, who is caregiver, and notified of positive urine culture result. RX Cipro 500 mg tab, one PO BID x five days, prescriber Orlean Bradford, PA called to [facility] pharmacy in [place] [phone]

## 2012-04-04 DEATH — deceased

## 2012-07-27 IMAGING — CT ABDOMEN^ABDOMEN_PELVIS_WITH (ADULT)
1 of 3 series · 14 of 32 positions shown, 19 images · IV contrast (APPLIED)
Comparison: None.

CLINICAL DATA: Lower abdominal pain.  New rectal bleeding today.
Diarrhea.

CT ABDOMEN AND PELVIS WITH CONTRAST
TECHNIQUE: Multidetector CT imaging of the abdomen and pelvis was
performed following the standard protocol during bolus
administration of intravenous contrast.
Contrast: 1 OMNIPAQUE IOHEXOL 300 MG/ML  SOLN, 100mL OMNIPAQUE
IOHEXOL 300 MG/ML  SOLN

[Series 2: abd/pelv with 5.0 b31f st · axial · 0.70mm/px · z∈[-344,+26]mm · 14 of 84 slices shown, 19 images]
[im 5/84  soft-tissue]
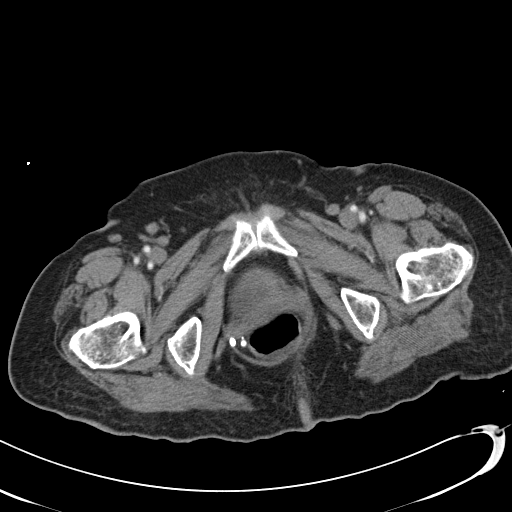
[im 5/84  bone]
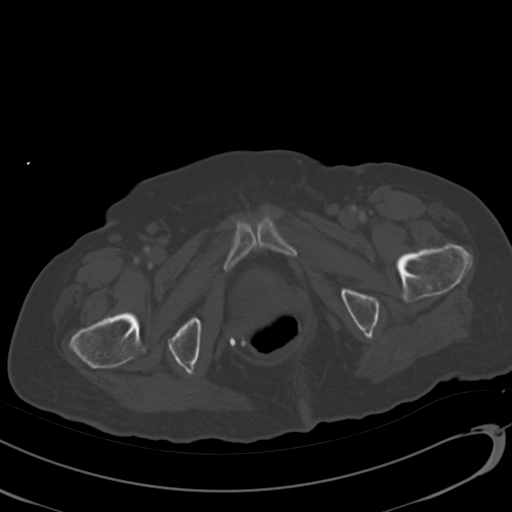
[im 10/84  soft-tissue]
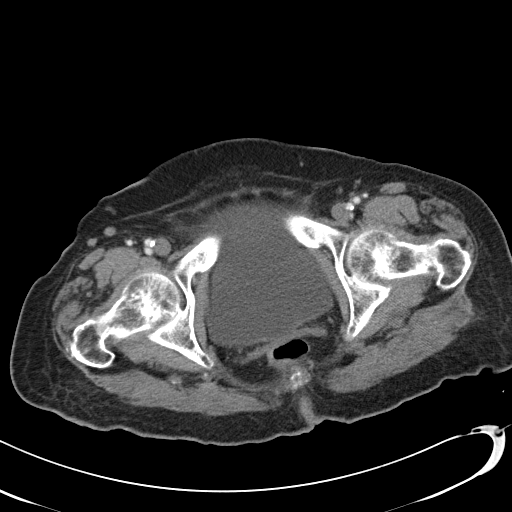
[im 19/84  soft-tissue]
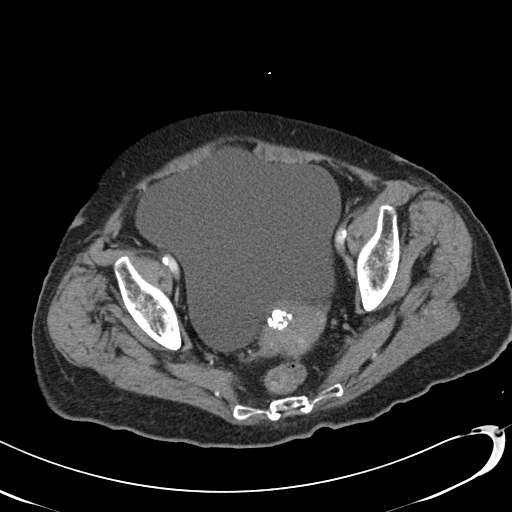
[im 24/84  soft-tissue]
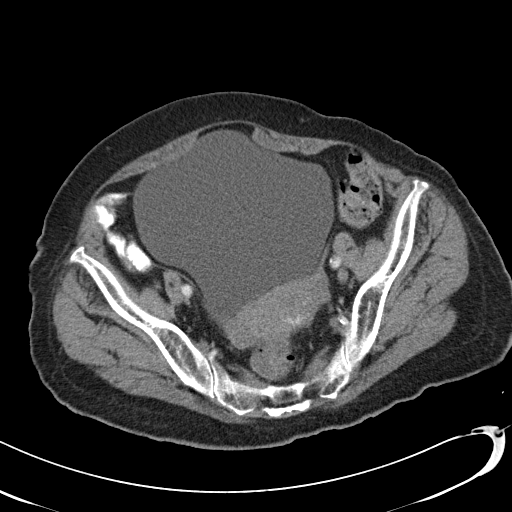
[im 28/84  soft-tissue]
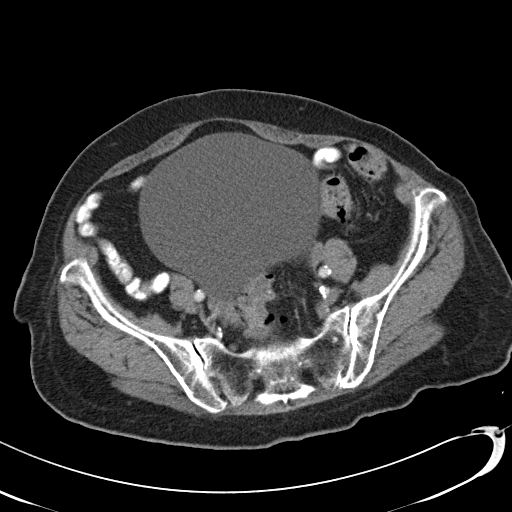
[im 37/84  soft-tissue]
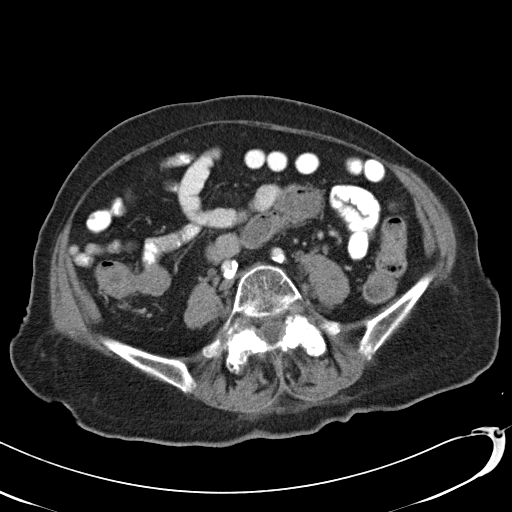
[im 42/84  soft-tissue]
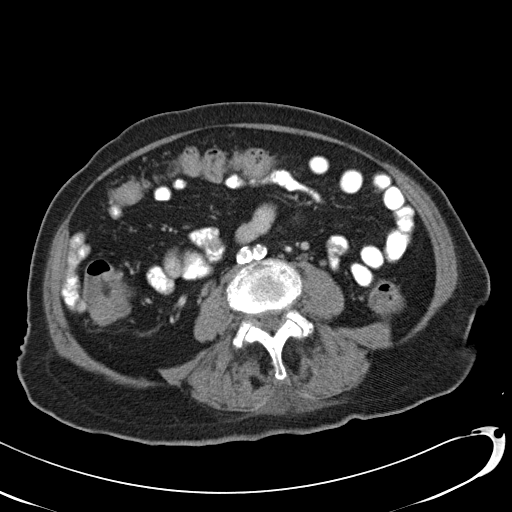
[im 47/84  soft-tissue]
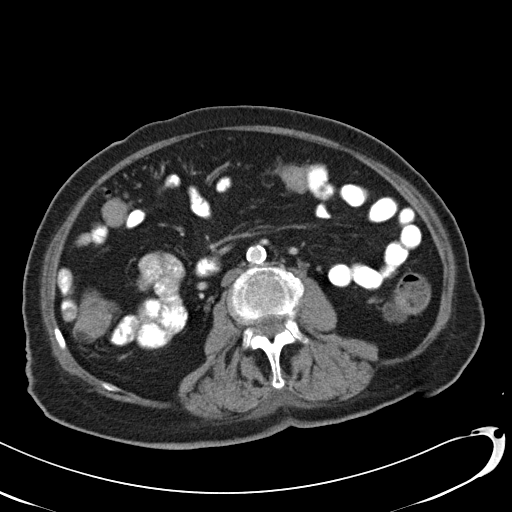
[im 56/84  soft-tissue]
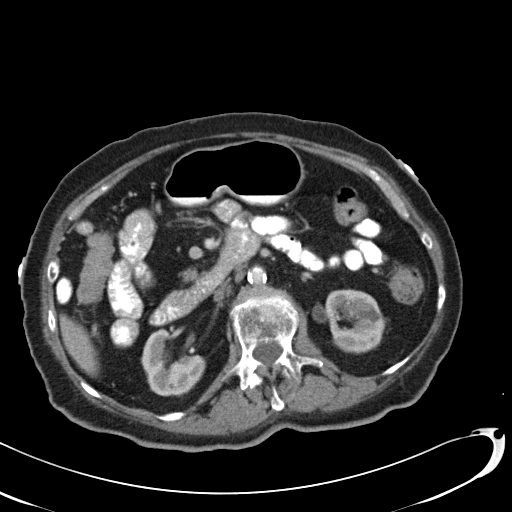
[im 56/84  bone]
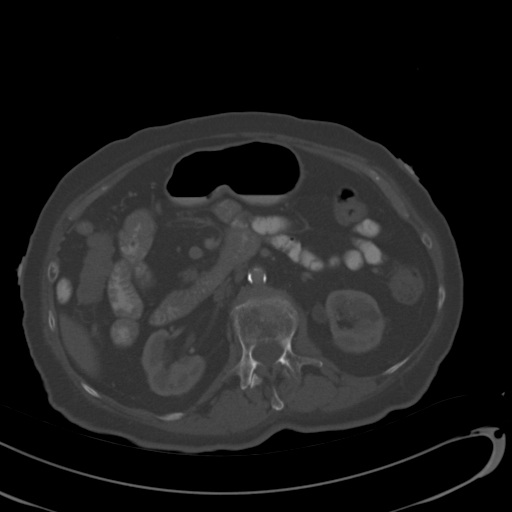
[im 60/84  soft-tissue]
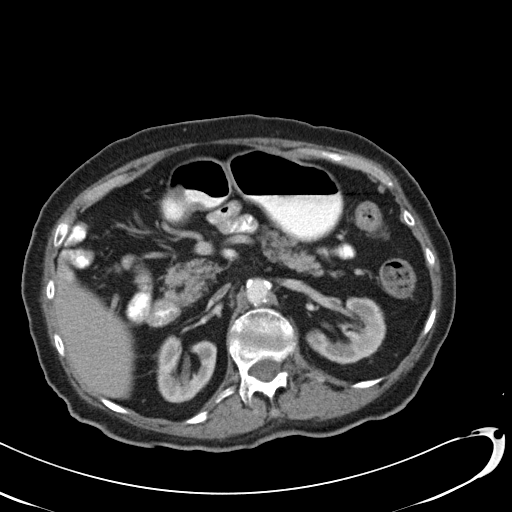
[im 65/84  soft-tissue]
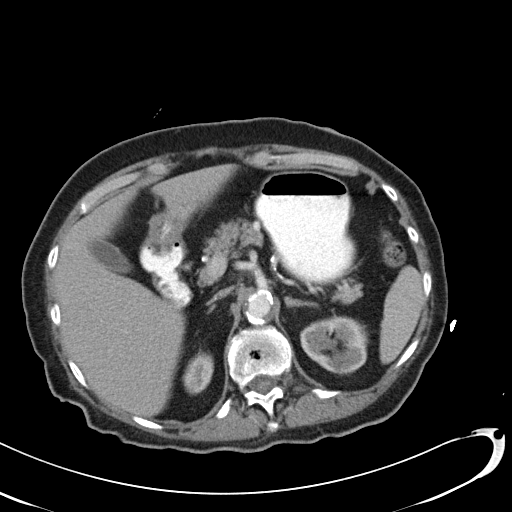
[im 65/84  lung]
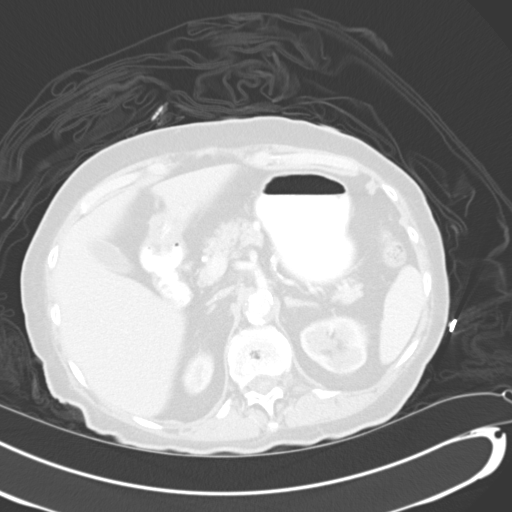
[im 70/84  lung]
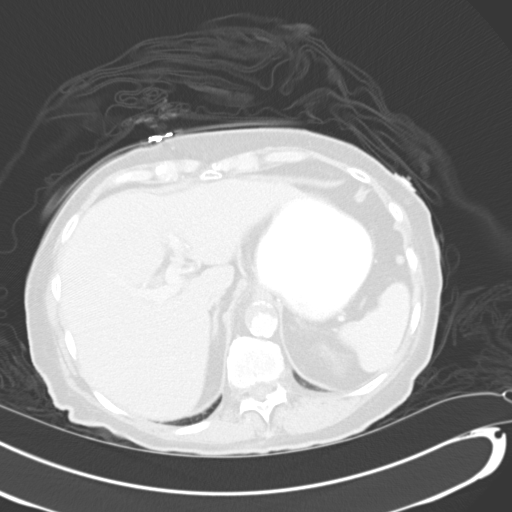
[im 74/84  soft-tissue]
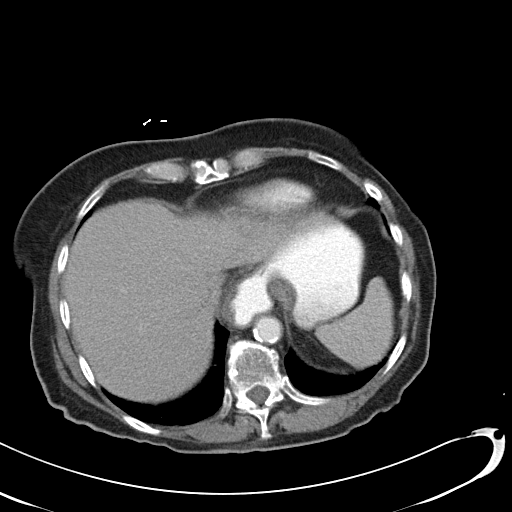
[im 74/84  lung]
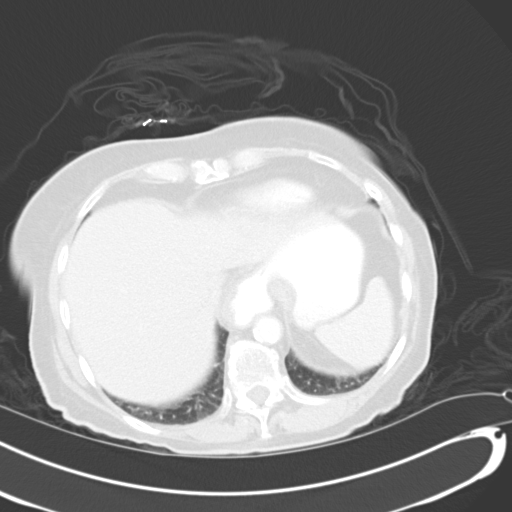
[im 79/84  soft-tissue]
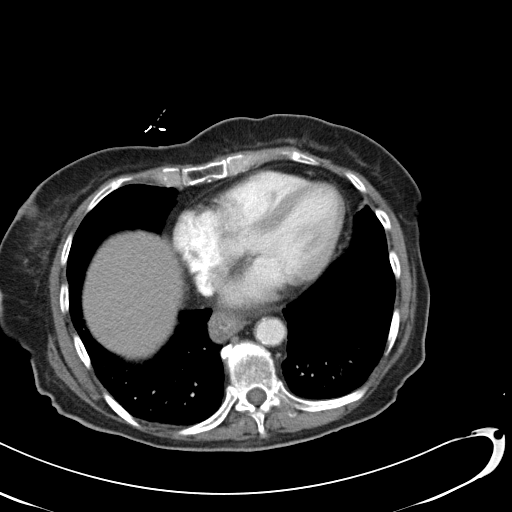
[im 79/84  lung]
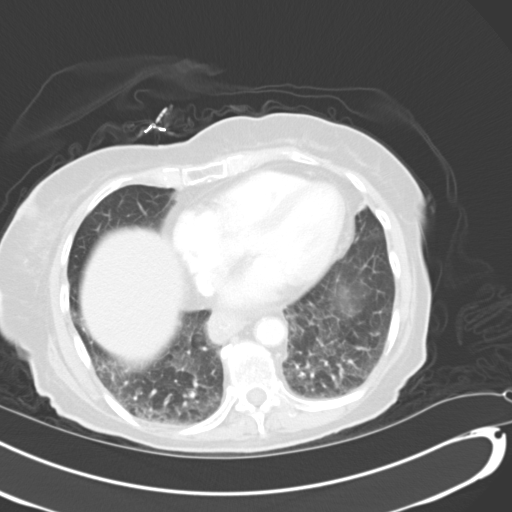

[14 of 32 positions shown; findings below may reference images not displayed]

FINDINGS: Infiltration or atelectasis bases.  Moderate sized
esophageal hiatal hernia.

The liver, spleen, gallbladder, pancreas, adrenal glands, and
retroperitoneal lymph nodes are unremarkable.  Diffuse
calcification of the abdominal aorta and branch vessels without
aneurysm.  Portal and mesenteric vessels appear patent.  Cyst in
the upper pole of the left kidney.  No solid mass or hydronephrosis
in either kidney.  The stomach is moderately well distended without
evidence of wall thickening.  No small bowel dilatation or wall
thickening.  Diffusely stool filled colon without distension or
wall thickening.  No free air or free fluid in the abdomen.

Pelvis:  The bladder is significantly distended.  No bladder wall
thickening.  Nodular calcifications within the uterus consistent
with fibroids.  No abnormal adnexal masses.  No free or loculated
pelvic fluid collections.  Diverticulosis of the sigmoid colon
without diverticulitis.  The appendix is not identified.
Degenerative changes in the lumbar spine.  Superior endplate
compression of L1.
IMPRESSION: Esophageal hiatal hernia.  Renal cysts.  Vascular calcifications.
Diffuse distension of the bladder.  Calcified uterine fibroids.  No
acute process demonstrated in the abdomen or pelvis.

## 2014-07-27 NOTE — H&P (Signed)
PATIENT NAME:  Breanna ComberRAY, Breanna Washington MR#:  [id number] DATE OF BIRTH:  [birth year]  DATE OF ADMISSION:  05/01/2011  PRIMARY CARE PHYSICIAN:  Dr. Loma Senderharles Phillips ER PHYSICIAN:  Dr. Jens SomWiegand  ADMITTING PHYSICIAN: Dr. Tilda FrancoAkuneme  PRESENTING COMPLAINT: Chest pain.   HISTORY OF PRESENT ILLNESS: The patient is an 81 year old lady with dementia who was at home in her usual state of health until this evening when she started complaining of sudden onset of chest pain and started clutching her chest.  She developed associated shortness of breath. For this she was brought to the Emergency Room where work-up included a CT angiogram of the chest due to elevated D-dimer and results showed pulmonary embolus in the right side of the chest. For this she was referred to the hospitalist for further evaluation. The patient was given some pain medication, morphine, and since then has been at rest without any more complaints. No prior history of sick contacts, fever, or loss of consciousness. No history of trauma. She has history of recurrent right lower extremity swelling that has been ongoing for several years now. Recent four-hour round-trip drive about two weeks ago. Otherwise the patient has been in steady health until this event.  She is currently under Hospice care prior to this event.   REVIEW OF SYSTEMS: Unreliable due to the patient's dementia but currently she complains of no pain or any symptoms at all.  PAST MEDICAL HISTORY:  1. Coronary artery disease.  2. Congestive heart failure.  3. History of transient ischemic attacks.  4. Hypertension. 5. Paroxysmal atrial fibrillation. 6. Hypothyroidism.  7. Dementia.   SURGICAL HISTORY: Appendectomy.   SOCIAL HISTORY:  The patient dips tobacco. No alcohol or other recreational drug use.   FAMILY HISTORY: Reviewed and noncontributory.   ALLERGIES: No known drug allergies.   MEDICATIONS:  1. Aspirin 325 mg daily.  2. Hydrochlorothiazide 12.5 mg daily.  3. Namenda 10  mg daily.  4. Omeprazole 20 mg daily.  5. Synthroid 25 mcg daily.  6. Tylenol 500 mg daily at bedtime.   PHYSICAL EXAMINATION:  VITAL SIGNS: Temperature 99, pulse 83, respiratory rate 20, blood pressure 155/90 on arrival and currently 202/101. Oxygen saturation 94% on 2 liters nasal cannula.   GENERAL: Elderly frail-looking Caucasian lady lying comfortably on the gurney, sleepy but easily arousable, in no obvious distress. Family at bedside, supportive.   HEENT: Atraumatic, normocephalic. Pupils equal, reactive to light and accommodation. Extraocular movement intact. Mucous membranes pink, moist.   NECK: Supple. No JV distention.   CHEST: Good air entry. Clear to auscultation.   HEART: Regular rate and rhythm. No murmurs.   ABDOMEN: Full, moves with respiration, nontender. Bowel sounds normoactive. No organomegaly.   EXTREMITIES: No edema. No clubbing. No deformity except for right lower extremity which has +1 pitting edema.   NEURO: Cranial nerves II through XII grossly intact. No focal motor or sensory deficit. Affect flat. Does not seem to have insight into her condition.   LABORATORY, DIAGNOSTIC, AND RADIOLOGICAL DATA: EKG shows sinus rhythm, rate of 82. No acute ST wave changes. CT chest showed right pulmonary embolism in segmental vessels.   CBC unremarkable. White count 8, hemoglobin 12, platelets 277. Chemistry unremarkable. Potassium 3.4. Sodium 143, creatinine 1, BUN 24, glucose 107, lipase 199. CK 32. Troponin negative. D-dimer is 2.   IMPRESSION:  1. Right-sided pulmonary embolism and chest pain. 2. Elevated blood pressure, query reactive in patient with hypertension.  3. History of coronary artery disease.  4. History of  paroxysmal atrial fibrillation  not on anticoagulation.  5. History of hypothyroidism on Synthroid.  6. Dementia.  7. Tobacco use.   PLAN:  1. Admit to general medical floor for serial cardiac enzymes, TSH, magnesium, and telemonitoring. Start the  patient on weight-based Lovenox dose 1 mg/kg  b.i.d.  2. Aspirin, nitroglycerin, pain medication, and if blood pressure is still elevated consider adding another antihypertensive agent.  3. Continue Synthroid and Namenda.  4. Seizure, fall, and aspiration precautions.  5. Protonix and nicotine patch.  6. CODE STATUS: DNR.  TOTAL PATIENT CARE TIME: 50 minutes.  ____________________________ Floy Sabina Tilda Franco, MD mia:bjt D: 05/01/2011 03:06:19 ET T: 05/01/2011 12:31:05 ET JOB#: [id number]  cc: Janika Jedlicka I. Tilda Franco, MD, <Dictator> Marcine Matar., MD Margaret Pyle MD ELECTRONICALLY SIGNED 05/02/2011 3:53

## 2014-07-27 NOTE — Discharge Summary (Signed)
PATIENT NAME:  Breanna Washington, Breanna Washington MR#:  [id number] DATE OF BIRTH:  1919/09/20  DATE OF ADMISSION:  05/01/2011 DATE OF DISCHARGE:  05/06/2011  DISCHARGE DIAGNOSES:  1. Chest pain secondary to pulmonary emboli.  2. Right-sided pulmonary emboli.   OTHER DIAGNOSES:  1. Hypertension. 2. Hypothyroidism. 3. Dementia.   DISCHARGE MEDICATIONS:  1. Synthroid 25 mcg p.o. daily.  2. Omeprazole 20 mg delayed release 1 capsule daily.  3. HCTZ 12.5 mg p.o. daily.  4. Namenda 10 mg p.o. b.i.d.  5. Coumadin 7.5 mg p.o. daily. Needs PT-INR checked by hospice on Monday and adjust the medicine. 6. Norvasc 5 mg p.o. daily.   DIET: Low sodium diet.   ACTIVITY: Walk with walker.   FOLLOW-UP: Resume hospice service at home.   CONSULTATIONS: None.   LABORATORY DATA DURING HOSPITAL STAY: D-dimer 2.01. Lipase 199. Troponin 0.02. WBC on admission 8.1, hemoglobin 12.7, hematocrit 37.9, platelets 277. Electrolytes: sodium 143, potassium 3.4, chloride 104, bicarbonate 29, BUN 25, creatinine 1.05, glucose 107 on admission. The patient's CT of the chest showed multiple filling defects are present in multiple segmental pulmonary arteries on the right side. Left side has no filling defects in the main, lobar or segmental arteries. The patient's abdominal ultrasound showed unremarkable right upper quadrant ultrasound. The patient's troponin was elevated. The second one is 0.09. Lower extremity ultrasound did not show any deep vein thrombosis in the right or left extremities. Echocardiogram showed a normal LV function with ejection fraction of more than 55% with mild diastolic dysfunction. INR today is 2.4.   HOSPITAL COURSE: 81 year old female patient with history of hypertension, dementia who is mainly bedridden, came in because of chest pain. The patient's D-dimer was elevated on admission. Troponins were slightly elevated, but EKG did not show any changes. The patient was started on Lovenox and Coumadin after discussing the  risks and benefits of anticoagulation. INR is therapeutic today, so we are discharging her with Coumadin. The patient is not a candidate also because creatinine clearance is around 30 and because of age and other risk factors, Xarelto is not started. The patient's mild elevation in troponins thought to be because of pulmonary embolus and echocardiogram showed mild diastolic dysfunction. Her chest pain has resolved and she is continued on Synthroid and Namenda for her hypothyroidism and dementia. For her high blood pressure, she is on HCTZ. However, the blood pressure stayed a little high and I have ordered the Norvasc 5 mg daily and blood pressure this morning 143/75 and pulse 59, and the patient will be getting Norvasc 5 mg daily on top of her HCTZ. The patient is seen by physical therapist during the hospital stay who recommended short-term rehab, but the family decided that she will be taken care of at home and they do not want any rehab. The patient does have hospice service, so she can resume the hospice service and the patient will have PT-INR checked and Coumadin will be adjusted according to the PT-INR. Discussed the plan with the patient and the patient's multiple family members, daughters and sons.  TOTAL TIME SPENT ON DISCHARGE PREPARATION: More than 30 minutes.    ____________________________ Katha HammingSnehalatha Moxon Messler, MD sk:ap D: 05/06/2011 11:41:58 ET T: 05/08/2011 12:51:24 ET JOB#: [id number]  cc: Katha HammingSnehalatha Deborah Dondero, MD, <Dictator> Katha HammingSNEHALATHA Vali Capano MD ELECTRONICALLY SIGNED 05/23/2011 16:26
